# Patient Record
Sex: Female | Born: 1975 | Race: White | Hispanic: No | Marital: Single | Smoking: Current every day smoker
Health system: Southern US, Community
[De-identification: ages and names within clinical notes are randomized; demographics above are authoritative.]

## PROBLEM LIST (undated history)

## (undated) DIAGNOSIS — T4145XA Adverse effect of unspecified anesthetic, initial encounter: Secondary | ICD-10-CM

## (undated) DIAGNOSIS — K219 Gastro-esophageal reflux disease without esophagitis: Secondary | ICD-10-CM

## (undated) DIAGNOSIS — R011 Cardiac murmur, unspecified: Secondary | ICD-10-CM

## (undated) DIAGNOSIS — T8859XA Other complications of anesthesia, initial encounter: Secondary | ICD-10-CM

## (undated) HISTORY — PX: HERNIA REPAIR: SHX51

## (undated) HISTORY — PX: HIP SURGERY: SHX245

## (undated) HISTORY — PX: CHOLECYSTECTOMY: SHX55

## (undated) HISTORY — PX: TONSILLECTOMY: SUR1361

---

## 2005-06-09 ENCOUNTER — Observation Stay: Payer: Self-pay

## 2005-06-17 ENCOUNTER — Inpatient Hospital Stay: Payer: Self-pay | Admitting: Obstetrics and Gynecology

## 2008-04-06 ENCOUNTER — Emergency Department: Payer: Self-pay | Admitting: Emergency Medicine

## 2008-05-01 ENCOUNTER — Ambulatory Visit: Payer: Self-pay | Admitting: General Surgery

## 2008-05-09 ENCOUNTER — Ambulatory Visit: Payer: Self-pay | Admitting: General Surgery

## 2008-08-22 ENCOUNTER — Ambulatory Visit: Payer: Self-pay | Admitting: General Surgery

## 2008-08-25 ENCOUNTER — Ambulatory Visit: Payer: Self-pay | Admitting: General Surgery

## 2010-08-12 ENCOUNTER — Other Ambulatory Visit: Payer: Self-pay | Admitting: General Surgery

## 2010-08-13 ENCOUNTER — Ambulatory Visit: Payer: Self-pay | Admitting: General Surgery

## 2010-08-27 ENCOUNTER — Ambulatory Visit: Payer: Self-pay | Admitting: General Surgery

## 2011-08-04 ENCOUNTER — Encounter: Payer: Self-pay | Admitting: Maternal & Fetal Medicine

## 2012-02-03 ENCOUNTER — Inpatient Hospital Stay: Payer: Self-pay

## 2012-02-04 LAB — CBC WITH DIFFERENTIAL/PLATELET
Basophil #: 0 10*3/uL (ref 0.0–0.1)
Eosinophil #: 0.1 10*3/uL (ref 0.0–0.7)
HCT: 33.9 % — ABNORMAL LOW (ref 35.0–47.0)
Lymphocyte #: 1.8 10*3/uL (ref 1.0–3.6)
MCH: 32.2 pg (ref 26.0–34.0)
MCHC: 35.7 g/dL (ref 32.0–36.0)
MCV: 90 fL (ref 80–100)
Monocyte #: 0.5 x10 3/mm (ref 0.2–0.9)
Monocyte %: 4.8 %
Neutrophil #: 8.4 10*3/uL — ABNORMAL HIGH (ref 1.4–6.5)
Platelet: 251 10*3/uL (ref 150–440)
RDW: 14.3 % (ref 11.5–14.5)
WBC: 11 10*3/uL (ref 3.6–11.0)

## 2012-02-05 LAB — HEMATOCRIT: HCT: 30.3 % — ABNORMAL LOW (ref 35.0–47.0)

## 2014-07-16 NOTE — Consult Note (Signed)
Referral Information:   Reason for Referral 39 year old G2P1001 at with EDD 02/10/2012 at 12 weeks 6 days by LMP consistent with ultrasound performed at [facility] today presents for consultation due to previous child with isolated clubbed foot, advanced maternal age, and maternal bmi>45.    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, now with normal alignment   Home Medications: Medication Instructions Status  ondansetron 8 mg oral tablet 1 tab(s) orally 2 times a day, As Needed- for Nausea, Vomiting  Active   Allergies:   PCN: Rash  Codeine: Rash  Tylenol: Rash  Vital Signs/Notes:  Nursing Vital Signs: **Vital Signs.:   13-May-13 08:48   Vital Signs Type Routine   Temperature Temperature (F) 96.7   Celsius 35.9   Temperature Source oral   Pulse Pulse 85   Pulse source per Dinamap   Respirations Respirations 14   Systolic BP Systolic BP 403   Diastolic BP (mmHg) Diastolic BP (mmHg) 77   Mean BP 97   BP Source Dinamap   Perinatal Consult:   PSurg Hx hip surgery following childhood trauma    FHx MI, MI--age 79 father (obesity, tobacco, etoh use)    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 --We addressed the low risk of recurrence (less than 2%) for clubbed foot.  --She met with our genetic counselor today(please see that letter for details) to discuss risks for  aneuploidy.  She elected to have noninvasive prenatal testing today/circulating cell free fetal DNA testing and a first trimester ultrasound 2. We addressed some of the concerns associated with maternal bmi over 40 including preeclampsia, gestational diabetes, and fetal macrosomia.  We also addressed IOM weight gain reccs (~15lbs) and the need for healthy dietary choices during pregnancy.  Recommendations for maternal baseline studies and fetal surveillance are outlined below.    Recommendations 1.  Cell free fetal DNA testing performed today. We will call with results. 2. Recommend detailed anatomic survey and serial growth assessments.  3. Recommend baseline maternal ECG, tSH, CMP, protein;Creatinine ratio (if not already performed)   Plan:   Genetic Counseling yes    Prenatal Diagnosis Options Level II [place]    Ultrasound at what gestational ages 61 and 71 weeks    Additional Testing Folate/prenatal vitamins     Total Time Spent with Patient 30 minutes    >50% of visit spent in couseling/coordination of care yes    Office Use Only [address]  Level 2 (6min) NEW office consult exp prob focused   Coding Description: MATERNAL CONDITIONS/HISTORY INDICATION(S).   Adv. Maternal Age - multigravida.   Obesity - BMI greater than equal to 30.  Electronic Signatures: Manfred Shirts (MD)  (Signed 13-May-13 14:28)  Authored: Referral, Home Medications, Allergies, Vital Signs/Notes, Consult, Exam, Radiology, Lab/Radiology Notes, Impression, Plan, Billing, Coding Description   Last Updated: 13-May-13 14:28 by Manfred Shirts (MD)

## 2014-08-01 NOTE — H&P (Signed)
L&D Evaluation:  History:   HPI Corrections    Patient's Surgical History Hip surgery, Tonsillectomy, herniax2, Cholecystectomy    Allergies PCN, Codeine   Electronic Signatures: Albertina ParrLugiano, Johanan Skorupski B (CNM)  (Signed [id number]-Nov-13 09:16)  Authored: L&D Evaluation   Last Updated: 13-Nov-13 09:16 by Albertina ParrLugiano, Maleko Greulich B (CNM)

## 2014-08-01 NOTE — H&P (Signed)
L&D Evaluation:  History:   HPI 39 y/o G2P1001 @ 39 EDC 02/10/12 arrived 2000 for cervidil ripening and IOL in am. [facility] (extreme obesity) FHR no decels but rare accels (nonreassuring) -sent to LD for prolonged monitoring-reactive strip- then home to prepare for IOL and back to LD at 2000. Pregnancy complicated by extreme obesity. Denied leaking fluid, vaginal bleeding or contractions, baby less active past day. GBS negative    Presents with abdominal pain    Patient's Medical History No Chronic Illness    Patient's Surgical History none    Medications Pre Natal Vitamins    Allergies NKDA    Social History none    Family History Non-Contributory   ROS:   ROS All systems were reviewed.  HEENT, CNS, GI, GU, Respiratory, CV, Renal and Musculoskeletal systems were found to be normal.   Exam:   Vital Signs stable    Urine Protein not completed    General no apparent distress    Mental Status clear    Chest clear    Heart normal sinus rhythm    Abdomen gravid, non-tender    Estimated Fetal Weight Large for gestational age    Fetal Position vtx    Fundal Height term    Back no CVAT    Edema 1+    Reflexes 2+    Clonus negative    Pelvic no external lesions, 2-3cm @ office today current exam 4cm thick vtx @ -2 BOWI small bloody show.    Mebranes Intact    FHT baseline 150's see description below    FHT Description 144    Ucx irregular, mild    Skin dry    Lymph no lymphadenopathy   Impression:   Impression decreased fetal movement, Cervidil IOL   Plan:   Plan EFM/NST, monitor contractions and for cervical change    Comments Cervidil placed with reactive tracing, occasional variable decels noted with pt on her back. Repositioned to left side 02 in place. FHR baseline 150's min/avg variability. FHR decel @ 0100 x 2 minutes down to 70's CNM contacted cervidil removed. FHr back up to baseline, repositioned. Cervical exam, 4cm thick vtx @ -2  bloody show present BOWI. Cervidil to remain out, CNM on unit. Pt reassured, questions answered.FHR 140's 150's occ accels, no uc's other than occasional cramping per pt, abdomen soft. EFM adjusted.   Electronic Signatures: Albertina ParrLugiano, Persia Lintner B (CNM)  (Signed [id number]-Nov-13 02:33)  Authored: L&D Evaluation   Last Updated: 13-Nov-13 02:33 by Albertina ParrLugiano, Matt Delpizzo B (CNM)

## 2015-04-24 ENCOUNTER — Emergency Department (HOSPITAL_COMMUNITY): Payer: BLUE CROSS/BLUE SHIELD

## 2015-04-24 ENCOUNTER — Emergency Department (HOSPITAL_COMMUNITY)
Admission: EM | Admit: 2015-04-24 | Discharge: 2015-04-24 | Disposition: A | Discharge status: Home / Self Care | Payer: BLUE CROSS/BLUE SHIELD | Attending: Emergency Medicine | Admitting: Emergency Medicine

## 2015-04-24 ENCOUNTER — Encounter (HOSPITAL_COMMUNITY): Payer: Self-pay

## 2015-04-24 DIAGNOSIS — Y999 Unspecified external cause status: Secondary | ICD-10-CM | POA: Diagnosis not present

## 2015-04-24 DIAGNOSIS — S0990XA Unspecified injury of head, initial encounter: Secondary | ICD-10-CM | POA: Diagnosis present

## 2015-04-24 DIAGNOSIS — W01118A Fall on same level from slipping, tripping and stumbling with subsequent striking against other sharp object, initial encounter: Secondary | ICD-10-CM | POA: Insufficient documentation

## 2015-04-24 DIAGNOSIS — Y92513 Shop (commercial) as the place of occurrence of the external cause: Secondary | ICD-10-CM | POA: Insufficient documentation

## 2015-04-24 DIAGNOSIS — S060X0A Concussion without loss of consciousness, initial encounter: Secondary | ICD-10-CM | POA: Insufficient documentation

## 2015-04-24 DIAGNOSIS — S01111A Laceration without foreign body of right eyelid and periocular area, initial encounter: Secondary | ICD-10-CM | POA: Diagnosis not present

## 2015-04-24 DIAGNOSIS — Y9301 Activity, walking, marching and hiking: Secondary | ICD-10-CM | POA: Insufficient documentation

## 2015-04-24 DIAGNOSIS — S8992XA Unspecified injury of left lower leg, initial encounter: Secondary | ICD-10-CM | POA: Diagnosis not present

## 2015-04-24 MED ORDER — TETANUS-DIPHTH-ACELL PERTUSSIS 5-2.5-18.5 LF-MCG/0.5 IM SUSP
0.5000 mL | Freq: Once | INTRAMUSCULAR | Status: DC
  Filled 2015-04-24: qty 0.5

## 2015-04-24 MED ORDER — TETANUS-DIPHTH-ACELL PERTUSSIS 5-2.5-18.5 LF-MCG/0.5 IM SUSP
0.5000 mL | Freq: Once | INTRAMUSCULAR | Status: AC
  Administered 2015-04-24: 0.5 mL via INTRAMUSCULAR

## 2015-04-24 MED ORDER — LIDOCAINE-EPINEPHRINE 2 %-1:200000 IJ SOLN
10.0000 mL | Freq: Once | INTRAMUSCULAR | Status: AC
Start: 2015-04-24 — End: 2015-04-24
  Administered 2015-04-24: 10 mL
  Filled 2015-04-24: qty 10

## 2015-04-24 NOTE — ED Provider Notes (Signed)
CSN: [id number]     Arrival date & time 04/24/15  2031 History  By signing my name below, I, Jamie Potts, attest that this documentation has been prepared under the direction and in the presence of Jamie Camprubi-Soms, PA-C. Electronically Signed: Budd Potts, ED Scribe. 04/24/2015. 9:19 PM.    Chief Complaint  Patient presents with  . Fall  . Head Laceration   Patient is a 40 y.o. female presenting with scalp laceration. The history is provided by the patient. No language interpreter was used.  Head Laceration This is a new problem. The current episode started 1 to 2 hours ago. The problem occurs rarely. The problem has been gradually improving. Pertinent negatives include no chest pain, no abdominal pain, no headaches and no shortness of breath. Nothing aggravates the symptoms. Nothing relieves the symptoms. She has tried nothing for the symptoms. The treatment provided no relief.   HPI Comments: Jamie Potts is a 40 y.o. female who presents to the Emergency Department complaining of a painful laceration just above the right eyebrow, sustained after a mechanical fall that occurred 1.5 hours ago. Pt states she was walking in [place] when she slipped and fell, striking her head on a wine bottle she was holding in her hand at the time, sustaining a laceration. She has applied a Band-Aid to control the bleeding, but has not taken any medication for this. Bleeding was controlled with pressure, but still oozes when bandage is removed. She reports associated intermittent, 3/10 dull, non-radiating pain to the laceration site, with no known aggravating factors, and no tx tried PTA. She also endorses mild left knee pain, but is not very concerned about this. She notes she had a small glass of wine (~4-6oz) before going to the store. She does not recall her last tetanus shot. She is not on any blood thinners. She denies any other medical issues. She has an IUD, so she has not had a menses in 3  years. Pt denies LOC, vision changes, HA, tinnitus, hearing loss, neck pain, back pain, lightheadedness, abd pain, n/v/d, constipation, urinary symptoms, numbness, tingling, weakness, CP, and SOB.  Pt is allergic to codeine (causes swelling) and penicillins.   History reviewed. No pertinent past medical history. Past Surgical History  Procedure Laterality Date  . Tonsillectomy    . Hip surgery Right   . Cholecystectomy     No family history on file. Social History  Substance Use Topics  . Smoking status: Never Smoker   . Smokeless tobacco: None  . Alcohol Use: Yes     Comment: occasionally   OB History    No data available     Review of Systems  HENT: Positive for facial swelling (R eyebrow). Negative for hearing loss and tinnitus.   Eyes: Negative for photophobia and visual disturbance.  Respiratory: Negative for shortness of breath.   Cardiovascular: Negative for chest pain.  Gastrointestinal: Negative for nausea, vomiting, abdominal pain, diarrhea and constipation.  Genitourinary: Negative for dysuria, hematuria and difficulty urinating.  Musculoskeletal: Positive for arthralgias (L knee). Negative for back pain, joint swelling and neck pain.  Skin: Positive for wound (R eyebrow lac).  Allergic/Immunologic: Negative for immunocompromised state.  Neurological: Negative for syncope, weakness, light-headedness, numbness and headaches.  Hematological: Does not bruise/bleed easily.  Psychiatric/Behavioral: Negative for confusion.   10 Systems reviewed and all are negative for acute change except as noted in the HPI.   Allergies  Codeine and Penicillins  Home Medications   Prior to Admission  medications   Not on File   BP 125/93 mmHg  Pulse 97  Temp(Src) 98 F (36.7 C) (Oral)  Resp 16  SpO2 97% Physical Exam  Constitutional: She is oriented to person, place, and time. Vital signs are normal. She appears well-developed and well-nourished.  Non-toxic appearance. No  distress.  Afebrile, nontoxic, NAD  HENT:  Head: Normocephalic. Head is with laceration. Head is without raccoon's eyes and without Battle's sign.  Mouth/Throat: Oropharynx is clear and moist and mucous membranes are normal. Normal dentition.  Right eyebrow laceration with some mild oozing, but no active hemorrhage. No scalp crepitus or step offs, mildly TTP over the R eyebrow without deformity. No racoon eyes or battle sign. No other maxillofacial TTP. No malocclusion or dentition injury. SEE PICTURE BELOW  Eyes: Conjunctivae are normal. Pupils are equal, round, and reactive to light. Right eye exhibits no discharge. Left eye exhibits no discharge. Right eye exhibits nystagmus. Right eye exhibits normal extraocular motion. Left eye exhibits nystagmus. Left eye exhibits normal extraocular motion.  PERRL, EOMI, with 3 beat horizontal nystagmus in both directions in both eyes, no visual field deficits. No vertical nystagmus.  Neck: Normal range of motion. Neck supple. No spinous process tenderness and no muscular tenderness present. No rigidity. Normal range of motion present.  FROM intact without spinous process TTP, no bony stepoffs or deformities, no paraspinous muscle TTP or muscle spasms. No rigidity or meningeal signs. No bruising or swelling.   Cardiovascular: Normal rate and intact distal pulses.   Pulmonary/Chest: Effort normal. No respiratory distress.  Abdominal: Normal appearance. She exhibits no distension.  Musculoskeletal: Normal range of motion.       Left knee: She exhibits normal range of motion, no swelling, no effusion, no deformity, normal alignment, no LCL laxity, normal patellar mobility and no MCL laxity. Tenderness found. Lateral joint line tenderness noted.       Thoracic back: Normal.       Lumbar back: Normal.  MAE x4 Strength and sensation grossly intact Distal pulses intact Gait steady All spinal levels non-TTP with no bony step offs.  L knee with FROM intact, with  mild lateral joint line TTP, no swelling/effusion/deformity, no bruising, no abnormal alignment or patellar mobility, no varus/valgus laxity, neg anterior drawer test, no crepitus.   Neurological: She is alert and oriented to person, place, and time. She has normal strength. No cranial nerve deficit or sensory deficit. She displays a negative Romberg sign. Coordination and gait normal. GCS eye subscore is 4. GCS verbal subscore is 5. GCS motor subscore is 6.  CN 2-12 grossly intact A&O x4 GCS 15 Sensation and strength intact Gait nonataxic including with tandem walking Coordination with finger-to-nose WNL Neg pronator drift  Negative Romberg  Skin: Skin is warm and dry. Laceration noted. No rash noted.  R eyebrow laceration as noted above and pictured below  Psychiatric: She has a normal mood and affect. Her behavior is normal.  Nursing note and vitals reviewed.     ED Course  .Marland KitchenLaceration Repair Date/Time: 04/24/2015 10:56 PM Performed by: Allen Derry Authorized by: Allen Derry Consent: Verbal consent obtained. Risks and benefits: risks, benefits and alternatives were discussed Consent given by: patient Patient understanding: patient states understanding of the procedure being performed Patient consent: the patient's understanding of the procedure matches consent given Patient identity confirmed: verbally with patient Body area: head/neck Location details: right eyebrow Laceration length: 5 cm Foreign bodies: no foreign bodies Tendon involvement: none Nerve involvement: none Vascular damage:  no Anesthesia: local infiltration Local anesthetic: lidocaine 2% with epinephrine Anesthetic total: 3 ml Patient sedated: no Preparation: Patient was prepped and draped in the usual sterile fashion. Irrigation solution: saline Irrigation method: syringe Amount of cleaning: standard Debridement: none Degree of undermining: none Skin closure: 5-0 Prolene Number  of sutures: 6 Technique: simple Approximation: close Approximation difficulty: simple Dressing: 4x4 sterile gauze and antibiotic ointment Patient tolerance: Patient tolerated the procedure well with no immediate complications Comments: Performed by Medical Student Eric Bifolck (unable to change provider on repair procedure due to medical student not being in the system) under my DIRECT supervision    DIAGNOSTIC STUDIES: Oxygen Saturation is 97% on RA, adequate by my interpretation.    COORDINATION OF CARE: 9:08 PM - Discussed plans to order diagnostic imaging, as well as a Tdap. Will numb, cleanse, and suture the wound. Pt advised of plan for treatment and pt agrees.  Labs Review Labs Reviewed - No data to display  Imaging Review Ct Head Wo Contrast  04/24/2015  CLINICAL DATA:  Fall in [place] with head laceration. Initial encounter. EXAM: CT HEAD WITHOUT CONTRAST CT MAXILLOFACIAL WITHOUT CONTRAST TECHNIQUE: Multidetector CT imaging of the head and maxillofacial structures were performed using the standard protocol without intravenous contrast. Multiplanar CT image reconstructions of the maxillofacial structures were also generated. COMPARISON:  None. FINDINGS: CT HEAD FINDINGS Skull and Sinuses:Facial findings described below. Negative for calvarial fracture. No hemo sinus. Brain: Normal. No evidence of acute infarction, hemorrhage, hydrocephalus, or mass lesion/mass effect. CT MAXILLOFACIAL FINDINGS Deep laceration above the right orbit without fracture, opaque foreign body, or evidence of globe injury. No postseptal hematoma. No hemo sinus. Impacted left upper central incisor. IMPRESSION: Right supraorbital laceration without fracture. Negative for intracranial injury. Electronically Signed   By: Marnee Spring M.D.   On: 04/24/2015 21:54   Ct Maxillofacial Wo Cm  04/24/2015  CLINICAL DATA:  Fall in [place] with head laceration. Initial encounter. EXAM: CT HEAD WITHOUT CONTRAST CT  MAXILLOFACIAL WITHOUT CONTRAST TECHNIQUE: Multidetector CT imaging of the head and maxillofacial structures were performed using the standard protocol without intravenous contrast. Multiplanar CT image reconstructions of the maxillofacial structures were also generated. COMPARISON:  None. FINDINGS: CT HEAD FINDINGS Skull and Sinuses:Facial findings described below. Negative for calvarial fracture. No hemo sinus. Brain: Normal. No evidence of acute infarction, hemorrhage, hydrocephalus, or mass lesion/mass effect. CT MAXILLOFACIAL FINDINGS Deep laceration above the right orbit without fracture, opaque foreign body, or evidence of globe injury. No postseptal hematoma. No hemo sinus. Impacted left upper central incisor. IMPRESSION: Right supraorbital laceration without fracture. Negative for intracranial injury. Electronically Signed   By: Marnee Spring M.D.   On: 04/24/2015 21:54   I have personally reviewed and evaluated these images and lab results as part of my medical decision-making.   MDM   Final diagnoses:  Head injury, initial encounter  Eyebrow laceration, right, initial encounter  Concussion, without loss of consciousness, initial encounter    40 y.o. female here with mechanical fall and R eyebrow lac when she struck her head on a wine bottle in [employer]. She admits to drinking wine today, ~4-6 ounces of wine prior to incident. No LOC. Some horizontal nystagmus noted during exam, but no other focal neuro deficits. Given +EtOH use today, will proceed with CT imaging to ensure no intracranial bleeding or maxillofacial injury. Will update tetanus. Will set up for lac repair. Pt declines wanting pain meds. Will reassess shortly.   9:20 PM Xray tech stating that pt  declines wanting L knee xray. Discussed reasoning on why, and she still declines. Will discontinue this one. Will proceed with CT head/maxillofacial and suture repair of lac. Will reassess shortly.   10:58 PM CT neg. Suture repair,  6 sutures used, good hemostasis. Wound cleaned and dressed. Discussed concussion protocol with mental rest. Ice, tylenol/ibuprofen discussed. F/up with PCP or [facility] in 7-10 days for recheck and suture removal. Doubt need for prophylactic abx. I explained the diagnosis and have given explicit precautions to return to the ER including for any other new or worsening symptoms. The patient understands and accepts the medical plan as it's been dictated and I have answered their questions. Discharge instructions concerning home care and prescriptions have been given. The patient is STABLE and is discharged to home in good condition.   I personally performed the services described in this documentation, which was scribed in my presence. The recorded information has been reviewed and is accurate.    BP 122/88 mmHg  Pulse 87  Temp(Src) 98 F (36.7 C) (Oral)  Resp 16  SpO2 98%  Meds ordered this encounter  Medications  . DISCONTD: Tdap (BOOSTRIX) injection 0.5 mL    Sig:   . lidocaine-EPINEPHrine (XYLOCAINE W/EPI) 2 %-1:200000 (PF) injection 10 mL    Sig:   . Tdap (BOOSTRIX) injection 0.5 mL    SigAllen Derry, PA-C 04/24/15 2311   Medical screening examination/treatment/procedure(s) were conducted as a shared visit with non-physician practitioner(s) and myself.  I personally evaluated the patient during the encounter.  The patient was awake, alert, in no distress. Patient had a minor fall, with full recall of the event. I was present for, assisted with and supervised to the laceration repair, which was well tolerated, with no complications.     Gerhard Munch, MD 04/25/15 0040

## 2015-04-24 NOTE — ED Notes (Signed)
Pt stable, ambulatory, states understanding of discharge instructions 

## 2015-04-24 NOTE — Discharge Instructions (Signed)
Laceration instructions: Keep wound clean with mild soap and water. Keep area covered with a topical antibiotic ointment and bandage, keep bandage dry, and do not submerge in water for 24 hours. Ice and elevate for additional pain relief and swelling. Alternate between ibuprofen and Tylenol for additional pain relief. Follow up with your primary care doctor or the [facility] in approximately 7-10 days for wound recheck and suture removal. Monitor area for signs of infection to include, but not limited to: increasing pain, spreading redness, drainage/pus, worsening swelling, or fevers. Return to emergency department for emergent changing or worsening symptoms.   Concussion instructions: Use Ibuprofen or Tylenol for pain. Get plenty of rest, use ice on your head.  Stay in a quiet, not simulating, dark environment. No TV, computer use, video games, or cell phone use until headache is resolved completely. Follow Up with primary care physician in 3-4 days if headache persists.  Return to the emergency department if patient becomes lethargic, begins vomiting or other change in mental status.    Concussion, Adult A concussion, or closed-head injury, is a brain injury caused by a direct blow to the head or by a quick and sudden movement (jolt) of the head or neck. Concussions are usually not life-threatening. Even so, the effects of a concussion can be serious. If you have had a concussion before, you are more likely to experience concussion-like symptoms after a direct blow to the head.  CAUSES  Direct blow to the head, such as from running into another player during a soccer game, being hit in a fight, or hitting your head on a hard surface.  A jolt of the head or neck that causes the brain to move back and forth inside the skull, such as in a car crash. SIGNS AND SYMPTOMS The signs of a concussion can be hard to notice. Early on, they may be missed by you, family members, and health care  providers. You may look fine but act or feel differently. Symptoms are usually temporary, but they may last for days, weeks, or even longer. Some symptoms may appear right away while others may not show up for hours or days. Every head injury is different. Symptoms include:  Mild to moderate headaches that will not go away.  A feeling of pressure inside your head.  Having more trouble than usual:  Learning or remembering things you have heard.  Answering questions.  Paying attention or concentrating.  Organizing daily tasks.  Making decisions and solving problems.  Slowness in thinking, acting or reacting, speaking, or reading.  Getting lost or being easily confused.  Feeling tired all the time or lacking energy (fatigued).  Feeling drowsy.  Sleep disturbances.  Sleeping more than usual.  Sleeping less than usual.  Trouble falling asleep.  Trouble sleeping (insomnia).  Loss of balance or feeling lightheaded or dizzy.  Nausea or vomiting.  Numbness or tingling.  Increased sensitivity to:  Sounds.  Lights.  Distractions.  Vision problems or eyes that tire easily.  Diminished sense of taste or smell.  Ringing in the ears.  Mood changes such as feeling sad or anxious.  Becoming easily irritated or angry for little or no reason.  Lack of motivation.  Seeing or hearing things other people do not see or hear (hallucinations). DIAGNOSIS Your health care provider can usually diagnose a concussion based on a description of your injury and symptoms. He or she will ask whether you passed out (lost consciousness) and whether you  are having trouble remembering events that happened right before and during your injury. Your evaluation might include:  A brain scan to look for signs of injury to the brain. Even if the test shows no injury, you may still have a concussion.  Blood tests to be sure other problems are not present. TREATMENT  Concussions are usually  treated in an emergency department, in urgent care, or at a clinic. You may need to stay in the hospital overnight for further treatment.  Tell your health care provider if you are taking any medicines, including prescription medicines, over-the-counter medicines, and natural remedies. Some medicines, such as blood thinners (anticoagulants) and aspirin, may increase the chance of complications. Also tell your health care provider whether you have had alcohol or are taking illegal drugs. This information may affect treatment.  Your health care provider will send you home with important instructions to follow.  How fast you will recover from a concussion depends on many factors. These factors include how severe your concussion is, what part of your brain was injured, your age, and how healthy you were before the concussion.  Most people with mild injuries recover fully. Recovery can take time. In general, recovery is slower in older persons. Also, persons who have had a concussion in the past or have other medical problems may find that it takes longer to recover from their current injury. HOME CARE INSTRUCTIONS General Instructions  Carefully follow the directions your health care provider gave you.  Only take over-the-counter or prescription medicines for pain, discomfort, or fever as directed by your health care provider.  Take only those medicines that your health care provider has approved.  Do not drink alcohol until your health care provider says you are well enough to do so. Alcohol and certain other drugs may slow your recovery and can put you at risk of further injury.  If it is harder than usual to remember things, write them down.  If you are easily distracted, try to do one thing at a time. For example, do not try to watch TV while fixing dinner.  Talk with family members or close friends when making important decisions.  Keep all follow-up appointments. Repeated evaluation of  your symptoms is recommended for your recovery.  Watch your symptoms and tell others to do the same. Complications sometimes occur after a concussion. Older adults with a brain injury may have a higher risk of serious complications, such as a blood clot on the brain.  Tell your teachers, school nurse, school counselor, coach, athletic trainer, or work Production designer, theatre/television/film about your injury, symptoms, and restrictions. Tell them about what you can or cannot do. They should watch for:  Increased problems with attention or concentration.  Increased difficulty remembering or learning new information.  Increased time needed to complete tasks or assignments.  Increased irritability or decreased ability to cope with stress.  Increased symptoms.  Rest. Rest helps the brain to heal. Make sure you:  Get plenty of sleep at night. Avoid staying up late at night.  Keep the same bedtime hours on weekends and weekdays.  Rest during the day. Take daytime naps or rest breaks when you feel tired.  Limit activities that require a lot of thought or concentration. These include:  Doing homework or job-related work.  Watching TV.  Working on the computer.  Avoid any situation where there is potential for another head injury (football, hockey, soccer, basketball, martial arts, downhill snow sports and horseback riding). Your condition will  get worse every time you experience a concussion. You should avoid these activities until you are evaluated by the appropriate follow-up health care providers. Returning To Your Regular Activities You will need to return to your normal activities slowly, not all at once. You must give your body and brain enough time for recovery.  Do not return to sports or other athletic activities until your health care provider tells you it is safe to do so.  Ask your health care provider when you can drive, ride a bicycle, or operate heavy machinery. Your ability to react may be slower after  a brain injury. Never do these activities if you are dizzy.  Ask your health care provider about when you can return to work or school. Preventing Another Concussion It is very important to avoid another brain injury, especially before you have recovered. In rare cases, another injury can lead to permanent brain damage, brain swelling, or death. The risk of this is greatest during the first 7-10 days after a head injury. Avoid injuries by:  Wearing a seat belt when riding in a car.  Drinking alcohol only in moderation.  Wearing a helmet when biking, skiing, skateboarding, skating, or doing similar activities.  Avoiding activities that could lead to a second concussion, such as contact or recreational sports, until your health care provider says it is okay.  Taking safety measures in your home.  Remove clutter and tripping hazards from floors and stairways.  Use grab bars in bathrooms and handrails by stairs.  Place non-slip mats on floors and in bathtubs.  Improve lighting in dim areas. SEEK MEDICAL CARE IF:  You have increased problems paying attention or concentrating.  You have increased difficulty remembering or learning new information.  You need more time to complete tasks or assignments than before.  You have increased irritability or decreased ability to cope with stress.  You have more symptoms than before. Seek medical care if you have any of the following symptoms for more than 2 weeks after your injury:  Lasting (chronic) headaches.  Dizziness or balance problems.  Nausea.  Vision problems.  Increased sensitivity to noise or light.  Depression or mood swings.  Anxiety or irritability.  Memory problems.  Difficulty concentrating or paying attention.  Sleep problems.  Feeling tired all the time. SEEK IMMEDIATE MEDICAL CARE IF:  You have severe or worsening headaches. These may be a sign of a blood clot in the brain.  You have weakness (even if  only in one hand, leg, or part of the face).  You have numbness.  You have decreased coordination.  You vomit repeatedly.  You have increased sleepiness.  One pupil is larger than the other.  You have convulsions.  You have slurred speech.  You have increased confusion. This may be a sign of a blood clot in the brain.  You have increased restlessness, agitation, or irritability.  You are unable to recognize people or places.  You have neck pain.  It is difficult to wake you up.  You have unusual behavior changes.  You lose consciousness. MAKE SURE YOU:  Understand these instructions.  Will watch your condition.  Will get help right away if you are not doing well or get worse.   This information is not intended to replace advice given to you by your health care provider. Make sure you discuss any questions you have with your health care provider.   Document Released: 05/31/2003 Document Revised: 03/31/2014 Document Reviewed: 09/30/2012 Elsevier Interactive  Patient Education 2016 [employer]  Facial Laceration A facial laceration is a cut on the face. These injuries can be painful and cause bleeding. Some cuts may need to be closed with stitches (sutures), skin adhesive strips, or wound glue. Cuts usually heal quickly but can leave a scar. It can take 1-2 years for the scar to go away completely. HOME CARE   Only take medicines as told by your doctor.  Follow your doctor's instructions for wound care. For Stitches:  Keep the cut clean and dry.  If you have a bandage (dressing), change it at least once a day. Change the bandage if it gets wet or dirty, or as told by your doctor.  Wash the cut with soap and water 2 times a day. Rinse the cut with water. Pat it dry with a clean towel.  Put a thin layer of medicated cream on the cut as told by your doctor.  You may shower after the first 24 hours. Do not soak the cut in water until the stitches are  removed.  Have your stitches removed as told by your doctor.  Do not wear any makeup until a few days after your stitches are removed. For Skin Adhesive Strips:  Keep the cut clean and dry.  Do not get the strips wet. You may take a bath, but be careful to keep the cut dry.  If the cut gets wet, pat it dry with a clean towel.  The strips will fall off on their own. Do not remove the strips that are still stuck to the cut. For Wound Glue:  You may shower or take baths. Do not soak or scrub the cut. Do not swim. Avoid heavy sweating until the glue falls off on its own. After a shower or bath, pat the cut dry with a clean towel.  Do not put medicine or makeup on your cut until the glue falls off.  If you have a bandage, do not put tape over the glue.  Avoid lots of sunlight or tanning lamps until the glue falls off.  The glue will fall off on its own in 5-10 days. Do not pick at the glue. After Healing:  Put sunscreen on the cut for the first year to reduce your scar. GET HELP IF:  You have a fever. GET HELP RIGHT AWAY IF:   Your cut area gets red, painful, or puffy (swollen).  You see a yellowish-white fluid (pus) coming from the cut.   This information is not intended to replace advice given to you by your health care provider. Make sure you discuss any questions you have with your health care provider.   Document Released: 08/27/2007 Document Revised: 03/31/2014 Document Reviewed: 10/21/2012 Elsevier Interactive Patient Education 2016 [employer]  Cryotherapy Cryotherapy is when you put ice on your injury. Ice helps lessen pain and puffiness (swelling) after an injury. Ice works the best when you start using it in the first 24 to 48 hours after an injury. HOME CARE  Put a dry or damp towel between the ice pack and your skin.  You may press gently on the ice pack.  Leave the ice on for no more than 10 to 20 minutes at a time.  Check your skin after 5 minutes to  make sure your skin is okay.  Rest at least 20 minutes between ice pack uses.  Stop using ice when your skin loses feeling (numbness).  Do not use ice on someone who cannot  tell you when it hurts. This includes small children and people with memory problems (dementia). GET HELP RIGHT AWAY IF:  You have white spots on your skin.  Your skin turns blue or pale.  Your skin feels waxy or hard.  Your puffiness gets worse. MAKE SURE YOU:   Understand these instructions.  Will watch your condition.  Will get help right away if you are not doing well or get worse.   This information is not intended to replace advice given to you by your health care provider. Make sure you discuss any questions you have with your health care provider.   Document Released: 08/27/2007 Document Revised: 06/02/2011 Document Reviewed: 10/31/2010 Elsevier Interactive Patient Education 2016 [employer]   Sutured Wound Care Sutures are stitches that can be used to close wounds. Taking care of your wound properly can help prevent pain and infection. It can also help your wound to heal more quickly. HOW TO CARE FOR YOUR SUTURED WOUND Wound Care  Keep the wound clean and dry.  If you were given a bandage (dressing), change it at least one time per day or as told by your doctor. You should also change it if it gets wet or dirty.  Keep the wound completely dry for the first 24 hours or as told by your doctor. After that time, you may shower or bathe. However, make sure that the wound is not soaked in water until the sutures have been removed.  Clean the wound one time each day or as told by your doctor.  Wash the wound with soap and water.  Rinse the wound with water to remove all soap.  Pat the wound dry with a clean towel. Do not rub the wound.  After cleaning the wound, put a thin layer of antibiotic ointment on it as told your doctor. This ointment:  Helps to prevent infection.  Keeps the  bandage from sticking to the wound.  Have the sutures removed as told by your doctor. General Instructions  Take or apply medicines only as told by your doctor.  To help prevent scarring, make sure to cover your wound with sunscreen whenever you are outside after the sutures are removed and the wound is healed. Make sure to wear a sunscreen of at least 30 SPF.  If you were prescribed an antibiotic medicine or ointment, finish all of it even if you start to feel better.  Do not scratch or pick at the wound.  Keep all follow-up visits as told by your doctor. This is important.  Check your wound every day for signs of infection. Watch for:  Redness, swelling, or pain.  Fluid, blood, or pus.  Raise (elevate) the injured area above the level of your heart while you are sitting or lying down, if possible.  Avoid stretching your wound.  Drink enough fluids to keep your pee (urine) clear or pale yellow. GET HELP IF:  You were given a tetanus shot and you have any of these where the needle went in:  Swelling.  Very bad pain.  Redness.  Bleeding.  You have a fever.  A wound that was closed breaks open.  You notice a bad smell coming from the wound.  You notice something coming out of the wound, such as wood or glass.  Medicine does not help your pain.  You have any of these at the site of the wound.  More redness.  More swelling.  More pain.  You have any of these  coming from the wound.  Fluid.  Blood.  Pus.  You notice a change in the color of your skin near the wound.  You need to change the bandage often due to fluid, blood, or pus coming from the wound.  You have a new rash.  You have numbness around the wound. GET HELP RIGHT AWAY IF:  You have very bad swelling around the wound.  Your pain suddenly gets worse and is very bad.  You have painful lumps near the wound or on skin that is anywhere on your body.  You have a red streak going away from  the wound.  The wound is on your hand or foot and you cannot move a finger or toe like normal.  The wound is on your hand or foot and you notice that your fingers or toes look pale or bluish.   This information is not intended to replace advice given to you by your health care provider. Make sure you discuss any questions you have with your health care provider.   Document Released: 08/27/2007 Document Revised: 07/25/2014 Document Reviewed: 10/20/2012 Elsevier Interactive Patient Education [employer]

## 2015-04-24 NOTE — ED Notes (Signed)
Was in [employer] walking and slipped falling and hit her head on the wine bottle she had in her hand. No loc. Lac to her forehead just above the right eyebrow.

## 2015-10-02 ENCOUNTER — Other Ambulatory Visit: Payer: Self-pay | Admitting: Family Medicine

## 2015-10-02 ENCOUNTER — Ambulatory Visit
Admission: RE | Admit: 2015-10-02 | Discharge: 2015-10-02 | Disposition: A | Discharge status: Home / Self Care | Payer: BLUE CROSS/BLUE SHIELD | Source: Ambulatory Visit | Attending: Family Medicine | Admitting: Family Medicine

## 2015-10-02 DIAGNOSIS — M7122 Synovial cyst of popliteal space [Baker], left knee: Secondary | ICD-10-CM | POA: Diagnosis not present

## 2015-10-02 DIAGNOSIS — M25562 Pain in left knee: Secondary | ICD-10-CM | POA: Insufficient documentation

## 2015-10-05 ENCOUNTER — Ambulatory Visit: Payer: BLUE CROSS/BLUE SHIELD

## 2015-12-26 ENCOUNTER — Other Ambulatory Visit: Payer: Self-pay | Admitting: Orthopedic Surgery

## 2015-12-26 DIAGNOSIS — M25462 Effusion, left knee: Secondary | ICD-10-CM

## 2015-12-26 DIAGNOSIS — M2392 Unspecified internal derangement of left knee: Secondary | ICD-10-CM

## 2015-12-26 DIAGNOSIS — G8929 Other chronic pain: Secondary | ICD-10-CM

## 2015-12-26 DIAGNOSIS — M25562 Pain in left knee: Principal | ICD-10-CM

## 2016-01-04 ENCOUNTER — Ambulatory Visit
Admission: RE | Admit: 2016-01-04 | Discharge: 2016-01-04 | Disposition: A | Discharge status: Home / Self Care | Payer: BLUE CROSS/BLUE SHIELD | Source: Ambulatory Visit | Attending: Orthopedic Surgery | Admitting: Orthopedic Surgery

## 2016-01-04 DIAGNOSIS — S83222A Peripheral tear of medial meniscus, current injury, left knee, initial encounter: Secondary | ICD-10-CM | POA: Insufficient documentation

## 2016-01-04 DIAGNOSIS — M948X6 Other specified disorders of cartilage, lower leg: Secondary | ICD-10-CM | POA: Diagnosis not present

## 2016-01-04 DIAGNOSIS — G8929 Other chronic pain: Secondary | ICD-10-CM

## 2016-01-04 DIAGNOSIS — M659 Synovitis and tenosynovitis, unspecified: Secondary | ICD-10-CM | POA: Insufficient documentation

## 2016-01-04 DIAGNOSIS — M25562 Pain in left knee: Secondary | ICD-10-CM

## 2016-01-04 DIAGNOSIS — X58XXXA Exposure to other specified factors, initial encounter: Secondary | ICD-10-CM | POA: Insufficient documentation

## 2016-01-04 DIAGNOSIS — M25462 Effusion, left knee: Secondary | ICD-10-CM

## 2016-01-04 DIAGNOSIS — M2392 Unspecified internal derangement of left knee: Secondary | ICD-10-CM

## 2016-01-22 ENCOUNTER — Encounter
Admission: RE | Admit: 2016-01-22 | Discharge: 2016-01-22 | Disposition: A | Discharge status: Home / Self Care | Payer: BLUE CROSS/BLUE SHIELD | Source: Ambulatory Visit | Attending: Unknown Physician Specialty | Admitting: Unknown Physician Specialty

## 2016-01-22 HISTORY — DX: Cardiac murmur, unspecified: R01.1

## 2016-01-22 HISTORY — DX: Adverse effect of unspecified anesthetic, initial encounter: T41.45XA

## 2016-01-22 HISTORY — DX: Gastro-esophageal reflux disease without esophagitis: K21.9

## 2016-01-22 HISTORY — DX: Other complications of anesthesia, initial encounter: T88.59XA

## 2016-01-22 NOTE — Patient Instructions (Signed)
  Your procedure is scheduled on: 01-30-16 Report to Same Day Surgery 2nd floor medical mall To find out your arrival time please call [phone] between 1PM - 3PM on  01-29-16  Remember: Instructions that are not followed completely may result in serious medical risk, up to and including death, or upon the discretion of your surgeon and anesthesiologist your surgery may need to be rescheduled.    _x___ 1. Do not eat food or drink liquids after midnight. No gum chewing or hard candies.     __x__ 2. No Alcohol for 24 hours before or after surgery.   __x__3. No Smoking for 24 prior to surgery.   ____  4. Bring all medications with you on the day of surgery if instructed.    __x__ 5. Notify your doctor if there is any change in your medical condition     (cold, fever, infections).     Do not wear jewelry, make-up, hairpins, clips or nail polish.  Do not wear lotions, powders, or perfumes. You may wear deodorant.  Do not shave 48 hours prior to surgery. Men may shave face and neck.  Do not bring valuables to the hospital.    [facility] is not responsible for any belongings or valuables.               Contacts, dentures or bridgework may not be worn into surgery.  Leave your suitcase in the car. After surgery it may be brought to your room.  For patients admitted to the hospital, discharge time is determined by your treatment team.   Patients discharged the day of surgery will not be allowed to drive home.    Please read over the following fact sheets that you were given:   [facility] and or MRSA Information   ____ Take these medicines the morning of surgery with A SIP OF WATER:    1. NONE  2.  3.  4.  5.  6.  ____Fleets enema or Magnesium Citrate as directed.   ____ Use CHG Soap or sage wipes as directed on instruction sheet   ____ Use inhalers on the day of surgery and bring to hospital day of surgery  ____ Stop metformin 2 days prior to  surgery    ____ Take 1/2 of usual insulin dose the night before surgery and none on the morning of           surgery.   ____ Stop aspirin or coumadin, or plavix  x__ Stop Anti-inflammatories such as Advil, Aleve, Ibuprofen, Motrin, Naproxen,          Naprosyn, Goodies powders or aspirin products. Ok to take Tylenol.   ____ Stop supplements until after surgery.    ____ Bring C-Pap to the hospital.    

## 2016-01-29 ENCOUNTER — Encounter: Payer: Self-pay | Admitting: *Deleted

## 2016-01-30 ENCOUNTER — Ambulatory Visit: Payer: BLUE CROSS/BLUE SHIELD | Admitting: Anesthesiology

## 2016-01-30 ENCOUNTER — Ambulatory Visit
Admission: RE | Admit: 2016-01-30 | Discharge: 2016-01-30 | Disposition: A | Discharge status: Home / Self Care | Payer: BLUE CROSS/BLUE SHIELD | Source: Ambulatory Visit | Attending: Unknown Physician Specialty | Admitting: Unknown Physician Specialty

## 2016-01-30 ENCOUNTER — Encounter: Payer: Self-pay | Admitting: *Deleted

## 2016-01-30 ENCOUNTER — Encounter
Admission: RE | Disposition: A | Discharge status: Home / Self Care | Payer: Self-pay | Source: Ambulatory Visit | Attending: Unknown Physician Specialty

## 2016-01-30 DIAGNOSIS — Z885 Allergy status to narcotic agent status: Secondary | ICD-10-CM | POA: Diagnosis not present

## 2016-01-30 DIAGNOSIS — M898X6 Other specified disorders of bone, lower leg: Secondary | ICD-10-CM | POA: Insufficient documentation

## 2016-01-30 DIAGNOSIS — Z833 Family history of diabetes mellitus: Secondary | ICD-10-CM | POA: Diagnosis not present

## 2016-01-30 DIAGNOSIS — Z825 Family history of asthma and other chronic lower respiratory diseases: Secondary | ICD-10-CM | POA: Diagnosis not present

## 2016-01-30 DIAGNOSIS — G8929 Other chronic pain: Secondary | ICD-10-CM | POA: Insufficient documentation

## 2016-01-30 DIAGNOSIS — Z8249 Family history of ischemic heart disease and other diseases of the circulatory system: Secondary | ICD-10-CM | POA: Diagnosis not present

## 2016-01-30 DIAGNOSIS — M23232 Derangement of other medial meniscus due to old tear or injury, left knee: Secondary | ICD-10-CM | POA: Diagnosis present

## 2016-01-30 DIAGNOSIS — Z87891 Personal history of nicotine dependence: Secondary | ICD-10-CM | POA: Diagnosis not present

## 2016-01-30 DIAGNOSIS — Z6841 Body Mass Index (BMI) 40.0 and over, adult: Secondary | ICD-10-CM | POA: Diagnosis not present

## 2016-01-30 DIAGNOSIS — M25562 Pain in left knee: Secondary | ICD-10-CM | POA: Diagnosis not present

## 2016-01-30 DIAGNOSIS — M1712 Unilateral primary osteoarthritis, left knee: Secondary | ICD-10-CM | POA: Insufficient documentation

## 2016-01-30 DIAGNOSIS — Z880 Allergy status to penicillin: Secondary | ICD-10-CM | POA: Diagnosis not present

## 2016-01-30 HISTORY — PX: KNEE ARTHROSCOPY: SHX127

## 2016-01-30 LAB — URINE DRUG SCREEN, QUALITATIVE (ARMC ONLY)
Amphetamines, Ur Screen: NOT DETECTED
BARBITURATES, UR SCREEN: NOT DETECTED
BENZODIAZEPINE, UR SCRN: NOT DETECTED
Cannabinoid 50 Ng, Ur ~~LOC~~: NOT DETECTED
Cocaine Metabolite,Ur ~~LOC~~: NOT DETECTED
MDMA (Ecstasy)Ur Screen: NOT DETECTED
METHADONE SCREEN, URINE: NOT DETECTED
OPIATE, UR SCREEN: NOT DETECTED
Phencyclidine (PCP) Ur S: NOT DETECTED
TRICYCLIC, UR SCREEN: NOT DETECTED

## 2016-01-30 LAB — POCT PREGNANCY, URINE: Preg Test, Ur: NEGATIVE

## 2016-01-30 SURGERY — ARTHROSCOPY, KNEE
Anesthesia: General | Laterality: Left

## 2016-01-30 MED ORDER — FAMOTIDINE 20 MG PO TABS
ORAL_TABLET | ORAL | Status: AC
  Administered 2016-01-30: 20 mg via ORAL
  Filled 2016-01-30: qty 1

## 2016-01-30 MED ORDER — MIDAZOLAM HCL 2 MG/2ML IJ SOLN
INTRAMUSCULAR | Status: DC | PRN
  Administered 2016-01-30: 2 mg via INTRAVENOUS

## 2016-01-30 MED ORDER — DEXAMETHASONE SODIUM PHOSPHATE 10 MG/ML IJ SOLN
INTRAMUSCULAR | Status: DC | PRN
  Administered 2016-01-30: 10 mg via INTRAVENOUS

## 2016-01-30 MED ORDER — FAMOTIDINE 20 MG PO TABS
20.0000 mg | ORAL_TABLET | Freq: Once | ORAL | Status: AC
  Administered 2016-01-30: 20 mg via ORAL

## 2016-01-30 MED ORDER — PROPOFOL 10 MG/ML IV BOLUS
INTRAVENOUS | Status: DC | PRN
  Administered 2016-01-30: 30 mg via INTRAVENOUS
  Administered 2016-01-30: 170 mg via INTRAVENOUS

## 2016-01-30 MED ORDER — BUPIVACAINE HCL (PF) 0.5 % IJ SOLN
INTRAMUSCULAR | Status: AC
  Filled 2016-01-30: qty 30

## 2016-01-30 MED ORDER — BUPIVACAINE HCL (PF) 0.5 % IJ SOLN
INTRAMUSCULAR | Status: DC | PRN
  Administered 2016-01-30: 15 mL

## 2016-01-30 MED ORDER — GLYCOPYRROLATE 0.2 MG/ML IJ SOLN
INTRAMUSCULAR | Status: DC | PRN
Start: 2016-01-30 — End: 2016-01-30
  Administered 2016-01-30: 0.2 mg via INTRAVENOUS

## 2016-01-30 MED ORDER — ONDANSETRON HCL 4 MG/2ML IJ SOLN
INTRAMUSCULAR | Status: DC | PRN
  Administered 2016-01-30: 4 mg via INTRAVENOUS

## 2016-01-30 MED ORDER — LIDOCAINE HCL (CARDIAC) 20 MG/ML IV SOLN
INTRAVENOUS | Status: DC | PRN
  Administered 2016-01-30: 100 mg via INTRAVENOUS

## 2016-01-30 MED ORDER — LACTATED RINGERS IV SOLN
INTRAVENOUS | Status: DC
  Administered 2016-01-30 (×2): via INTRAVENOUS

## 2016-01-30 MED ORDER — FENTANYL CITRATE (PF) 100 MCG/2ML IJ SOLN: 25.0000 ug | INTRAMUSCULAR | Status: DC | PRN

## 2016-01-30 MED ORDER — FENTANYL CITRATE (PF) 100 MCG/2ML IJ SOLN
INTRAMUSCULAR | Status: DC | PRN
  Administered 2016-01-30 (×2): 50 ug via INTRAVENOUS
  Administered 2016-01-30 (×2): 25 ug via INTRAVENOUS
  Administered 2016-01-30: 50 ug via INTRAVENOUS

## 2016-01-30 MED ORDER — ONDANSETRON HCL 4 MG/2ML IJ SOLN: 4.0000 mg | Freq: Once | INTRAMUSCULAR | Status: DC | PRN

## 2016-01-30 SURGICAL SUPPLY — 40 items
ARTHROWAND PARAGON T2 (SURGICAL WAND)
BLADE ABRADER 4.5 (BLADE) ×3 IMPLANT
BLADE FULL RADIUS 3.5 (BLADE) ×3 IMPLANT
BLADE SHAVER 4.5X7 STR FR (MISCELLANEOUS) IMPLANT
BNDG ESMARK 6X12 TAN STRL LF (GAUZE/BANDAGES/DRESSINGS) ×3 IMPLANT
BUR ABRADER 4.0 W/FLUTE AQUA (MISCELLANEOUS) ×2 IMPLANT
BURR ABRADER 4.0 W/FLUTE AQUA (MISCELLANEOUS) ×6
CHLORAPREP W/TINT 26ML (MISCELLANEOUS) ×3 IMPLANT
CUFF TOURN 24 STER (MISCELLANEOUS) IMPLANT
CUFF TOURN 30 STER DUAL PORT (MISCELLANEOUS) ×3 IMPLANT
DRAPE LEGGINS SURG 28X43 STRL (DRAPES) ×3 IMPLANT
GAUZE SPONGE 4X4 12PLY STRL (GAUZE/BANDAGES/DRESSINGS) ×3 IMPLANT
GLOVE BIO SURGEON STRL SZ7.5 (GLOVE) ×3 IMPLANT
GLOVE BIO SURGEON STRL SZ8 (GLOVE) ×3 IMPLANT
GLOVE INDICATOR 8.0 STRL GRN (GLOVE) ×3 IMPLANT
GOWN STRL REUS W/ TWL LRG LVL3 (GOWN DISPOSABLE) ×1 IMPLANT
GOWN STRL REUS W/TWL LRG LVL3 (GOWN DISPOSABLE) ×2
GOWN STRL REUS W/TWL LRG LVL4 (GOWN DISPOSABLE) ×3 IMPLANT
IV LACTATED RINGER IRRG 3000ML (IV SOLUTION) ×4
IV LR IRRIG 3000ML ARTHROMATIC (IV SOLUTION) ×2 IMPLANT
KIT RM TURNOVER STRD PROC AR (KITS) ×3 IMPLANT
MANIFOLD 4PT FOR NEPTUNE1 (MISCELLANEOUS) ×3 IMPLANT
PACK ARTHROSCOPY KNEE (MISCELLANEOUS) ×3 IMPLANT
PADDING CAST 4IN STRL (MISCELLANEOUS)
PADDING CAST BLEND 4X4 STRL (MISCELLANEOUS) IMPLANT
SET TUBE SUCT SHAVER OUTFL 24K (TUBING) ×3 IMPLANT
SET TUBE TIP INTRA-ARTICULAR (MISCELLANEOUS) ×3 IMPLANT
SOL PREP PVP 2OZ (MISCELLANEOUS) ×3
SOLUTION PREP PVP 2OZ (MISCELLANEOUS) ×1 IMPLANT
SUT ETH BLK MONO 3 0 FS 1 12/B (SUTURE) ×3 IMPLANT
SUT ETHILON 3-0 FS-10 30 BLK (SUTURE) ×3
SUTURE EHLN 3-0 FS-10 30 BLK (SUTURE) ×1 IMPLANT
TAPE MICROFOAM 4IN (TAPE) ×3 IMPLANT
TUBING ARTHRO INFLOW-ONLY STRL (TUBING) ×3 IMPLANT
WAND 30 DEG SABER W/CORD (SURGICAL WAND) IMPLANT
WAND ARTHRO PARAGON T2 (SURGICAL WAND) IMPLANT
WAND COVAC 50 IFS (MISCELLANEOUS) IMPLANT
WAND HAND CNTRL MULTIVAC 50 (MISCELLANEOUS) IMPLANT
WAND HAND CNTRL MULTIVAC 90 (MISCELLANEOUS) IMPLANT
WRAP KNEE W/COLD PACKS 25.5X14 (SOFTGOODS) ×3 IMPLANT

## 2016-01-30 NOTE — Anesthesia Preprocedure Evaluation (Addendum)
Anesthesia Evaluation  Patient identified by MRN, date of birth, ID band Patient awake    Reviewed: Allergy & Precautions, NPO status , Patient's Chart, lab work & pertinent test results, reviewed documented beta blocker date and time   Airway Mallampati: III  TM Distance: >3 FB     Dental  (+) Chipped   Pulmonary former smoker,           Cardiovascular      Neuro/Psych    GI/Hepatic   Endo/Other  Morbid obesity  Renal/GU      Musculoskeletal   Abdominal   Peds  Hematology   Anesthesia Other Findings She needed O2 in pacu after umbilical hernia repair.  Reproductive/Obstetrics                            Anesthesia Physical Anesthesia Plan  ASA: III  Anesthesia Plan: General   Post-op Pain Management:    Induction: Intravenous  Airway Management Planned: Oral ETT and LMA  Additional Equipment:   Intra-op Plan:   Post-operative Plan:   Informed Consent: I have reviewed the patients History and Physical, chart, labs and discussed the procedure including the risks, benefits and alternatives for the proposed anesthesia with the patient or authorized representative who has indicated his/her understanding and acceptance.     Plan Discussed with: CRNA  Anesthesia Plan Comments:         Anesthesia Quick Evaluation

## 2016-01-30 NOTE — Op Note (Signed)
Patient: Jamie Potts, [name]  Preoperative diagnosis: Torn medial meniscus left knee with possible chondral lesion  Postop diagnosis: Torn medial meniscus left knee along with medial femoral chondral lesion  Operation: Arthroscopic partial medial meniscectomy plus microfracture of chondral lesion  Surgeon: Randon GoldsmithKERNODLE,Othmar Ringer B, MD  Anesthesia: Gen.   History: Patient's had a long history of left knee pain that was the result of a work injury.  The plain films revealed no bony pathology .  The patient had an MRI which revealed a torn medial meniscus plus possible medial femoral chondral pathology.The patient was scheduled for surgery due to persistent discomfort despite conservative treatment.  The patient was taken the operating room where satisfactory general anesthesia was achieved. A tourniquet and leg holder were was applied to the left thigh. A well leg support was applied to the nonoperative extremity. The left knee was prepped and draped in usual fashion for an arthroscopic procedure. An inflow cannula was introduced superomedially. The joint was distended with lactated Ringer's. Scope was introduced through an inferolateral puncture wound and a probe through an inferomedial puncture wound. Inspection of the medial compartment revealed  a significant radial tear at the junction of the posterior and middle thirds of the medial meniscus. The patient was also noted to have about a 1 cm in diameter almost full-thickness chondral lesion in the posterior aspect of the medial femoral condyle. I went ahead and resected the torn the medial meniscus with a combination of basket biters and a motorized resector. I then curetted the chondral lesion down to and through the calcified cartilage layer. The lesion was microfractured with a 45 awl. Inspection of the intercondylar notch revealed intact cruciates. Inspection of the the lateral compartment revealed no meniscal or chondral pathology.   Trochlear groove was  inspected and appeared to be fairly smooth.  Inspection of the retropatellar surface revealed some grade 2 chondral changes. I observed patella tracking from the inferolateral portal. The patella seemed to track fairly well.  The instruments were removed from the joint at this time. The puncture wounds were closed with 3-0 nylon in vertical mattress fashion. I injected each puncture wound with several cc of half percent Marcaine without epinephrine. Betadine was applied the wounds followed by sterile dressing. An ice pack was applied to the right knee. The patient was awakened and transferred to the stretcher bed. The patient was taken to the recovery room in satisfactory condition.  The tourniquet was not inflated during the course of the procedure. Blood loss was negligible.

## 2016-01-30 NOTE — Anesthesia Procedure Notes (Signed)
Procedure Name: LMA Insertion Date/Time: 01/30/2016 7:40 AM Performed by: Michaele OfferSAVAGE, Lilymae Swiech Pre-anesthesia Checklist: Patient identified, Emergency Drugs available, Suction available, Patient being monitored and Timeout performed Patient Re-evaluated:Patient Re-evaluated prior to inductionOxygen Delivery Method: Circle system utilized Preoxygenation: Pre-oxygenation with 100% oxygen Intubation Type: IV induction Ventilation: Mask ventilation without difficulty LMA: LMA inserted LMA Size: 4.0 Number of attempts: 1 Placement Confirmation: positive ETCO2 and breath sounds checked- equal and bilateral Tube secured with: Tape Dental Injury: Teeth and Oropharynx as per pre-operative assessment

## 2016-01-30 NOTE — Anesthesia Postprocedure Evaluation (Signed)
Anesthesia Post Note  Patient: Jamie Potts  Procedure(s) Performed: Procedure(s) (LRB): ARTHROSCOPY KNEE (Left)  Patient location during evaluation: PACU Anesthesia Type: General Level of consciousness: awake and alert Pain management: pain level controlled Vital Signs Assessment: post-procedure vital signs reviewed and stable Respiratory status: spontaneous breathing, nonlabored ventilation, respiratory function stable and patient connected to nasal cannula oxygen Cardiovascular status: blood pressure returned to baseline and stable Postop Assessment: no signs of nausea or vomiting Anesthetic complications: no    Last Vitals:  Vitals:   01/30/16 0957 01/30/16 1032  BP: (!) 138/96 130/85  Pulse: 83 72  Resp: 16 16  Temp: 36.4 C     Last Pain:  Vitals:   01/30/16 0957  TempSrc: Oral  PainSc:                  [name] S

## 2016-01-30 NOTE — H&P (Signed)
  H and P reviewed. No changes. Uploaded at later date. 

## 2016-01-30 NOTE — Progress Notes (Signed)
DBP  Ranging between  90 to 102   No new orders from dr Maisie Fusthomas at this time

## 2016-01-30 NOTE — Transfer of Care (Signed)
Immediate Anesthesia Transfer of Care Note  Patient: Jamie Potts  Procedure(s) Performed: Procedure(s): ARTHROSCOPY KNEE (Left)  Patient Location: PACU  Anesthesia Type:General  Level of Consciousness: awake, alert , oriented and patient cooperative  Airway & Oxygen Therapy: Patient Spontanous Breathing and Patient connected to face mask oxygen  Post-op Assessment: Report given to RN, Post -op Vital signs reviewed and stable and Patient moving all extremities X 4  Post vital signs: Reviewed and stable  Last Vitals:  Vitals:   01/30/16 0611  BP: (!) 157/90  Pulse: 87  Resp: 16  Temp: 36.7 C    Last Pain:  Vitals:   01/30/16 0611  TempSrc: Tympanic  PainSc: 2          Complications: No apparent anesthesia complications

## 2016-01-30 NOTE — Progress Notes (Signed)
Elevated leg on pillows and ice pack on

## 2016-01-30 NOTE — Discharge Instructions (Addendum)
General Anesthesia, Adult, Care After °Refer to this sheet in the next few weeks. These instructions provide you with information on caring for yourself after your procedure. Your health care provider may also give you more specific instructions. Your treatment has been planned according to current medical practices, but problems sometimes occur. Call your health care provider if you have any problems or questions after your procedure. °WHAT TO EXPECT AFTER THE PROCEDURE °After the procedure, it is typical to experience: °· Sleepiness. °· Nausea and vomiting. °HOME CARE INSTRUCTIONS °· For the first 24 hours after general anesthesia: °¨ Have a responsible person with you. °¨ Do not drive a car. If you are alone, do not take public transportation. °¨ Do not drink alcohol. °¨ Do not take medicine that has not been prescribed by your health care provider. °¨ Do not sign important papers or make important decisions. °¨ You may resume a normal diet and activities as directed by your health care provider. °· Change bandages (dressings) as directed. °· If you have questions or problems that seem related to general anesthesia, call the hospital and ask for the anesthetist or anesthesiologist on call. °SEEK MEDICAL CARE IF: °· You have nausea and vomiting that continue the day after anesthesia. °· You develop a rash. °SEEK IMMEDIATE MEDICAL CARE IF:  °· You have difficulty breathing. °· You have chest pain. °· You have any allergic problems. °  °This information is not intended to replace advice given to you by your health care provider. Make sure you discuss any questions you have with your health care provider. °  °Document Released: 06/16/2000 Document Revised: 03/31/2014 Document Reviewed: 07/09/2011 °Elsevier Interactive Patient Education ©2016 [employer] ° ° °[facility]  °Harold B. [facility], Jr., M.D. [phone]  ° °KNEE ARTHROSCOPY POST OPERATION INSTRUCTIONS: ° °PLEASE  READ THESE INSTRUCTIONS ABOUT POST OPERATION CARE. THEY WILL ANSWER MOST OF YOUR QUESTIONS.  °You have been given a prescription for pain. Please take as directed for pain.  °You can walk, keeping the knee slightly stiff-avoid doing too much bending the first day. (if ACL reconstruction is performed, keep brace locked in extension when walking.)  °You will use crutches or cane if needed. Can weight bear as tolerated  °Plan to take three to four days off from work. You can resume work when you are comfortable. (This can be a week or more, depending on the type of work you do.)  °To reduce pain and swelling, place one to two pillows under the knee the first two or three days when sitting or lying. An ice pack may be placed on top of the area over the dressing. Instructions for making homemade icepack are as follow:  °Flexible homemade alcohol water ice pack  °2 cups water  °1 cup rubbing alcohol  °food coloring for the blue tint (optional)  °2 zip-top bags - gallon-size  °Mix the water and alcohol together in one of your zip-top bags and add food coloring. Release as much air as possible and seal the bag. Place in freezer for at least 12 hours.  °The small incisions in your knee are closed with nylon stitches. They will be removed in the office.  °The bulky dressing may be removed in the third day after surgery. (If ACL surgery-DO NOT REMOVE BANDAGES). Put a waterproof band-aid over each stitch. Do not put any creams or ointments on wounds. You may shower at this time, but change waterproof band-aids after showering. KEEP INCISIONS CLEAN   AND DRY UNTIL YOU RETURN TO THE OFFICE.  °Sometimes the operative area remains somewhat painful and swollen for several weeks. This is usually nothing to worry about, but call if you have any excessive symptoms, especially fever. It is not unusual to have a low grade fever of 99 degrees for the first few days. If persist after 3-4 days call the office. It is not uncommon for the pain  to be a little worse on the third day after surgery.  °Begin doing gentle exercises right away. They will be limited by the amount of pain and swelling you have.  Exercising will reduce the swelling, increase motion, and prevent muscle weakness. Exercises: Straight leg raising and gentle knee bending.  °Take 81 milligram aspirin twice a day for 2 weeks after meals or milk. This along with elevation will help reduce the possibility of phlebitis in your operated leg.  °Avoid strenuous athletics for a minimum of 4 to 6 weeks after arthroscopic surgery (approximately five months if ACL surgery).  °If the surgery included ACL reconstruction the brace that is supplied to the extremity post surgery is to be locked in extension when you are asleep and is to be locked in extension when you are ambulating. It can be unlocked for exercises or sitting.  °Keep your post surgery appointment that has been made for you. If you do not remember the date call [phone]. Your follow up appointment should be between 7-10 days.  ° °

## 2018-02-05 ENCOUNTER — Other Ambulatory Visit: Payer: Self-pay | Admitting: Certified Nurse Midwife

## 2018-02-05 DIAGNOSIS — Z1231 Encounter for screening mammogram for malignant neoplasm of breast: Secondary | ICD-10-CM

## 2018-08-20 ENCOUNTER — Emergency Department: Payer: BLUE CROSS/BLUE SHIELD

## 2018-08-20 DIAGNOSIS — Z5321 Procedure and treatment not carried out due to patient leaving prior to being seen by health care provider: Secondary | ICD-10-CM | POA: Diagnosis not present

## 2018-08-20 DIAGNOSIS — M79605 Pain in left leg: Secondary | ICD-10-CM | POA: Insufficient documentation

## 2018-08-20 NOTE — ED Triage Notes (Signed)
Patient c/o left leg pain beginning Monday on upper/medial thigh. Patient reports pain moved to lower left leg on Tuesday and has not resolved. Patient reports hx of surgery to left knee 2 years ago.

## 2018-08-20 NOTE — ED Notes (Signed)
Patient transported to Ultrasound 

## 2018-08-21 ENCOUNTER — Emergency Department
Admission: EM | Admit: 2018-08-21 | Discharge: 2018-08-21 | Disposition: A | Discharge status: Home / Self Care | Payer: BLUE CROSS/BLUE SHIELD | Attending: Emergency Medicine | Admitting: Emergency Medicine

## 2018-08-21 NOTE — ED Notes (Signed)
Called no answer. Not seen in lobby 

## 2018-08-21 NOTE — ED Notes (Signed)
Patient seen walking outside.

## 2018-08-21 NOTE — ED Notes (Signed)
Called, no answer.

## 2019-05-13 ENCOUNTER — Other Ambulatory Visit: Payer: Self-pay | Admitting: Certified Nurse Midwife

## 2019-05-13 DIAGNOSIS — Z1231 Encounter for screening mammogram for malignant neoplasm of breast: Secondary | ICD-10-CM

## 2021-04-25 ENCOUNTER — Other Ambulatory Visit: Payer: Self-pay | Admitting: Obstetrics and Gynecology

## 2021-04-25 DIAGNOSIS — Z1231 Encounter for screening mammogram for malignant neoplasm of breast: Secondary | ICD-10-CM

## 2021-05-28 ENCOUNTER — Other Ambulatory Visit: Payer: Self-pay

## 2021-05-28 ENCOUNTER — Ambulatory Visit
Admission: RE | Admit: 2021-05-28 | Discharge: 2021-05-28 | Disposition: A | Discharge status: Home / Self Care | Payer: BC Managed Care – PPO | Source: Ambulatory Visit | Attending: Obstetrics and Gynecology | Admitting: Obstetrics and Gynecology

## 2021-05-28 DIAGNOSIS — Z1231 Encounter for screening mammogram for malignant neoplasm of breast: Secondary | ICD-10-CM | POA: Insufficient documentation

## 2021-10-14 ENCOUNTER — Ambulatory Visit: Payer: BC Managed Care – PPO | Admitting: Dermatology

## 2022-04-21 ENCOUNTER — Other Ambulatory Visit: Payer: Self-pay | Admitting: Obstetrics and Gynecology

## 2022-04-21 DIAGNOSIS — Z1231 Encounter for screening mammogram for malignant neoplasm of breast: Secondary | ICD-10-CM

## 2022-07-08 ENCOUNTER — Ambulatory Visit
Admission: RE | Admit: 2022-07-08 | Discharge: 2022-07-08 | Disposition: A | Discharge status: Home / Self Care | Payer: BC Managed Care – PPO | Source: Ambulatory Visit | Attending: Obstetrics and Gynecology | Admitting: Obstetrics and Gynecology

## 2022-07-08 DIAGNOSIS — Z1231 Encounter for screening mammogram for malignant neoplasm of breast: Secondary | ICD-10-CM | POA: Diagnosis present

## 2022-08-21 IMAGING — MG MM DIGITAL SCREENING BILAT W/ TOMO AND CAD
8 series · 8 of 24 positions shown · non-contrast
Comparison: Previous exam(s).

CLINICAL DATA: Screening.

EXAM:
DIGITAL SCREENING BILATERAL MAMMOGRAM WITH TOMOSYNTHESIS AND CAD
TECHNIQUE: Bilateral screening digital craniocaudal and mediolateral oblique
mammograms were obtained. Bilateral screening digital breast
tomosynthesis was performed. The images were evaluated with
computer-aided detection.

[L MLO synth-2D]
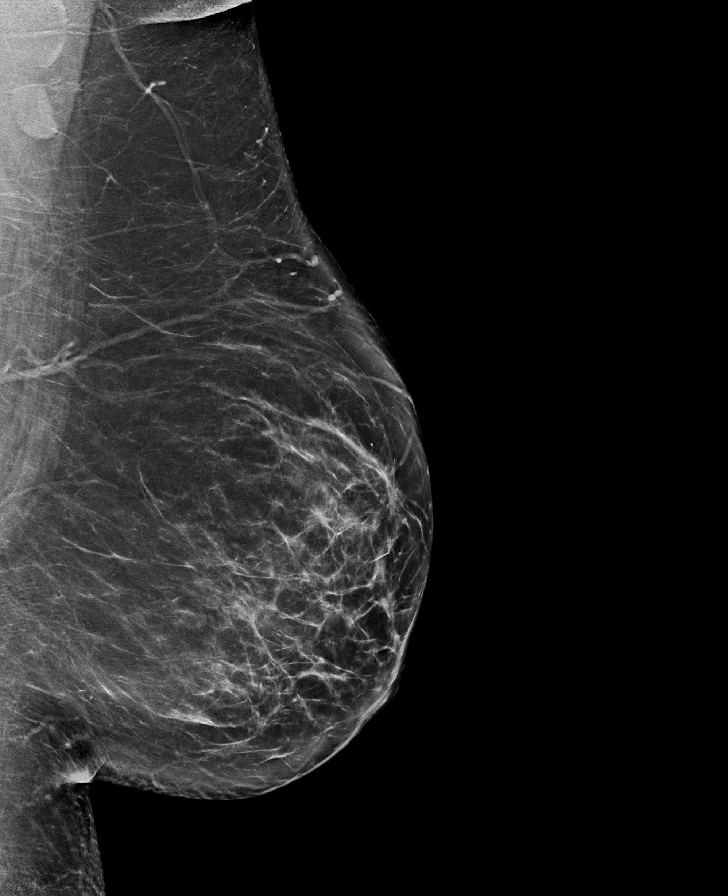

[L CC synth-2D]
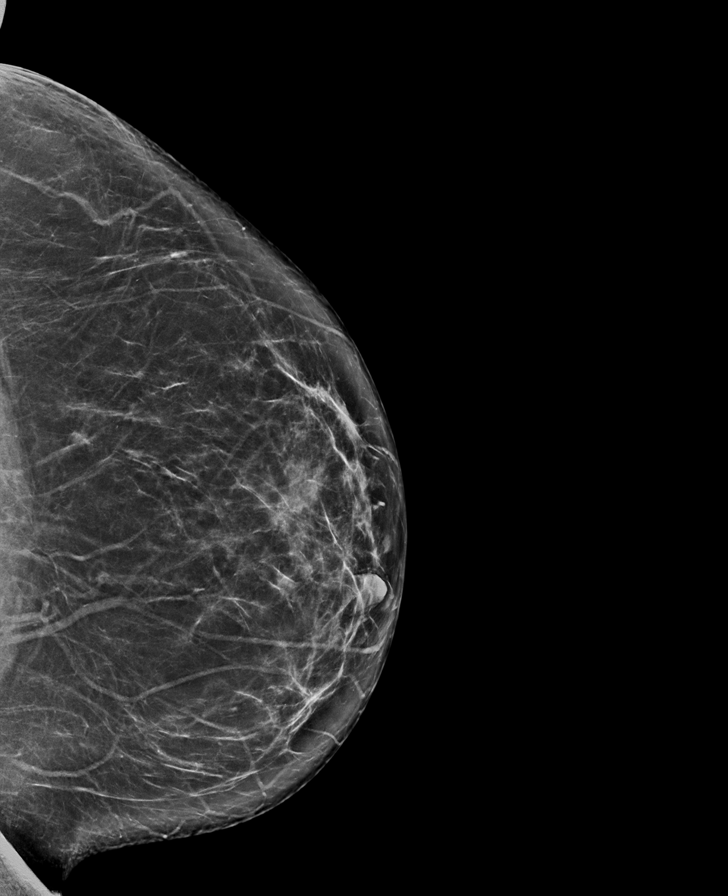

[R CC synth-2D]
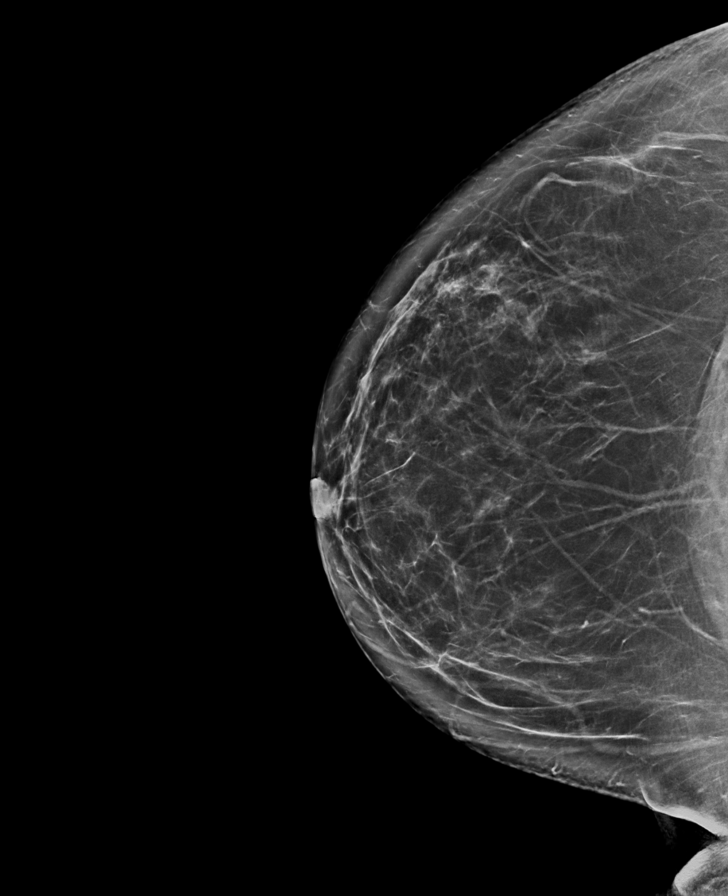

[R MLO synth-2D]
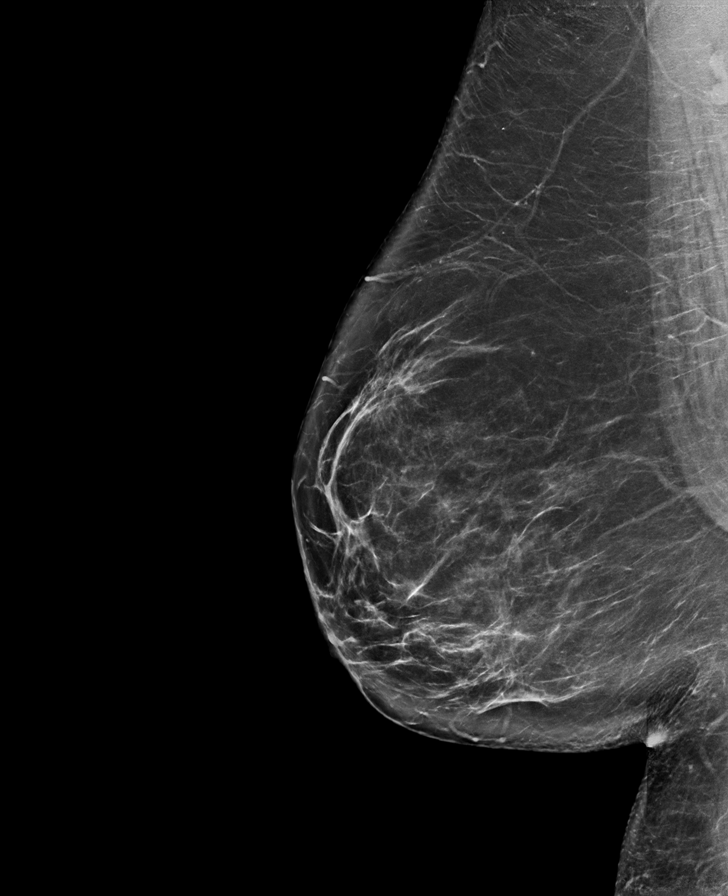

[R MLO tomo · tomo slice 46/91.0]
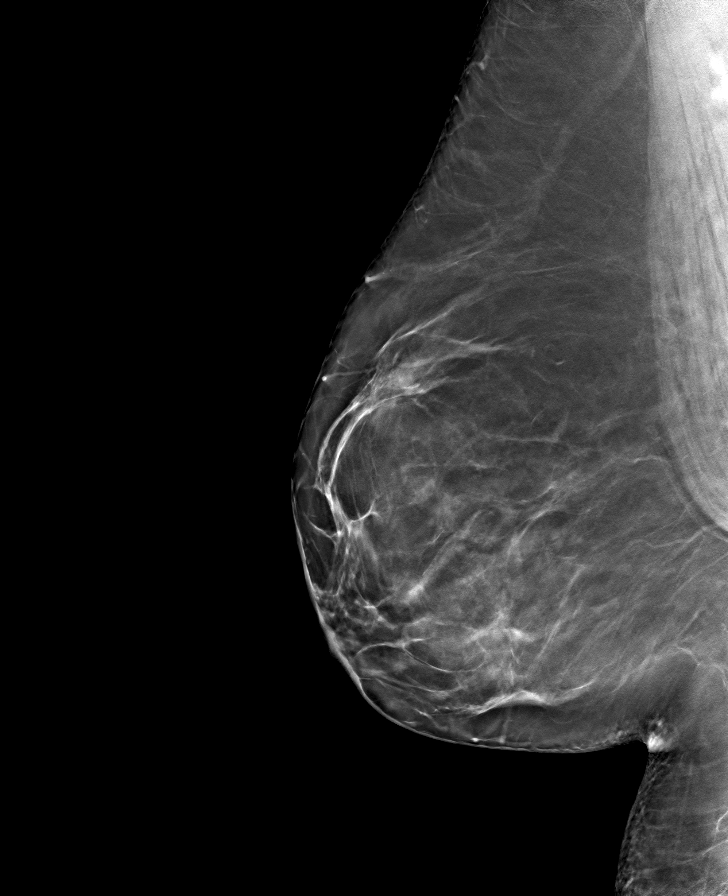

[L CC tomo · tomo slice 43/84.0]
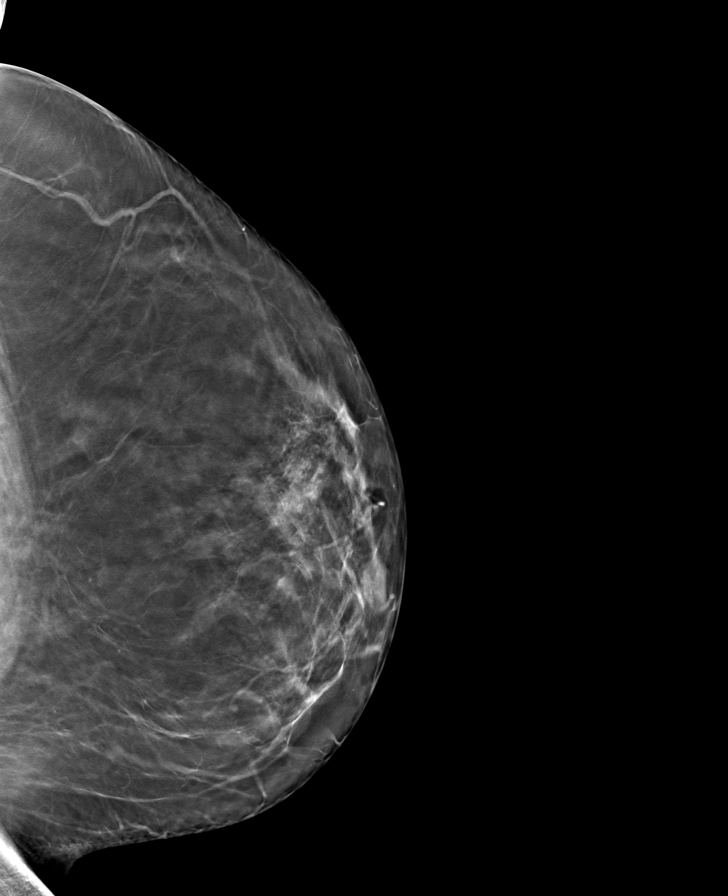

[R CC tomo · tomo slice 45/88.0]
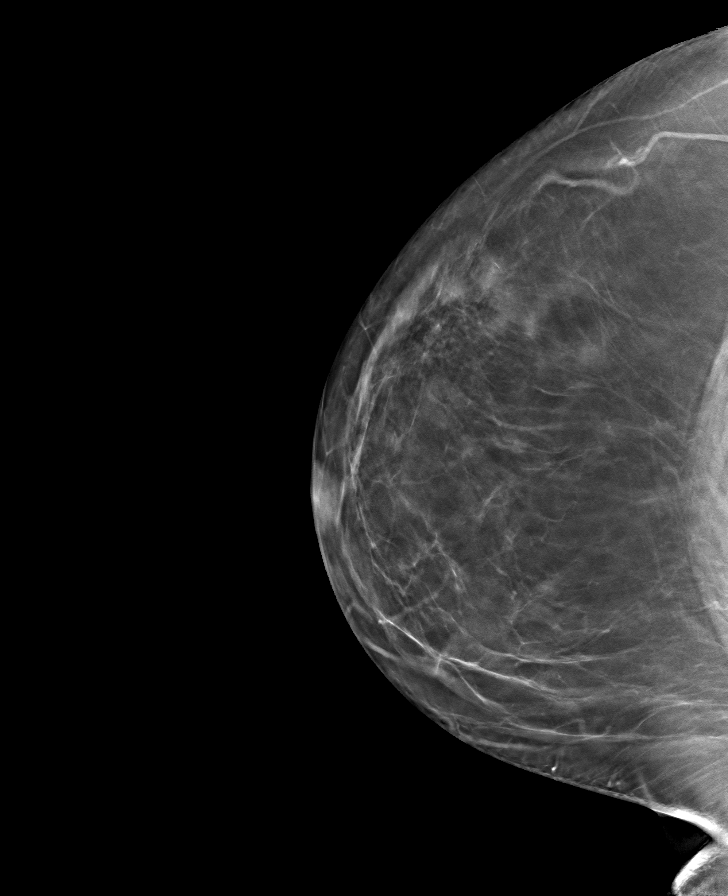

[L MLO tomo · tomo slice 46/91.0]
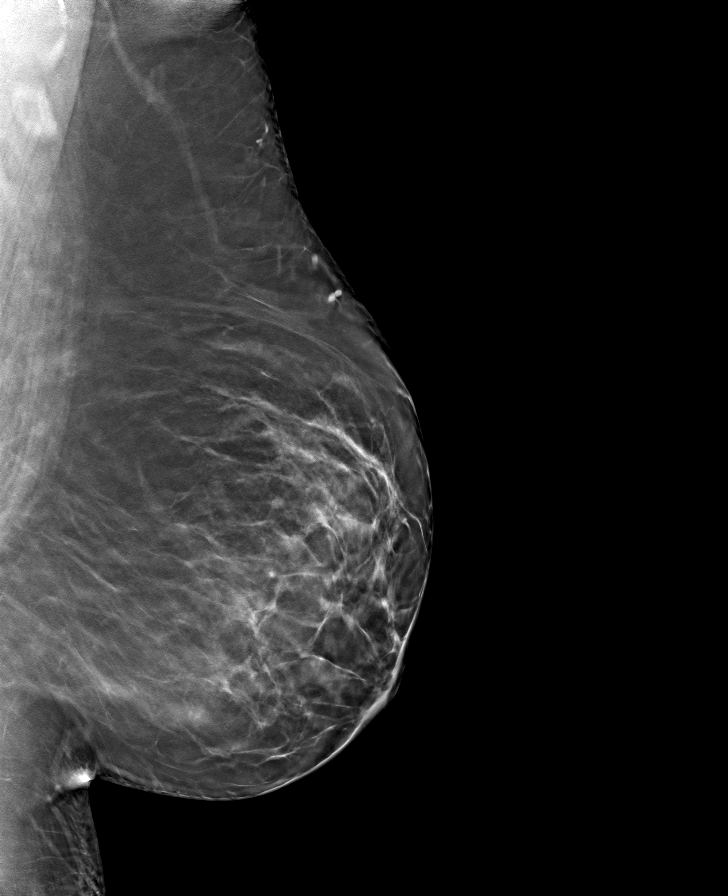

[8 of 24 positions shown; findings below may reference images not displayed]

ACR Breast Density Category b: There are scattered areas of
fibroglandular density.
FINDINGS: There are no findings suspicious for malignancy.
IMPRESSION: No mammographic evidence of malignancy. A result letter of this
screening mammogram will be mailed directly to the patient.

RECOMMENDATION:
Screening mammogram in one year. (Code:51-O-LD2)

BI-RADS CATEGORY  1: Negative.

## 2022-11-14 ENCOUNTER — Telehealth: Payer: Self-pay | Admitting: Family Medicine

## 2022-11-17 ENCOUNTER — Ambulatory Visit: Payer: Self-pay | Admitting: Family Medicine

## 2023-06-02 ENCOUNTER — Other Ambulatory Visit: Payer: Self-pay | Admitting: Obstetrics

## 2023-06-02 ENCOUNTER — Encounter: Payer: Self-pay | Admitting: Obstetrics and Gynecology

## 2023-06-02 DIAGNOSIS — Z1231 Encounter for screening mammogram for malignant neoplasm of breast: Secondary | ICD-10-CM

## 2023-07-14 ENCOUNTER — Encounter

## 2023-07-17 ENCOUNTER — Encounter

## 2023-09-03 ENCOUNTER — Encounter

## 2023-12-08 ENCOUNTER — Ambulatory Visit
Admission: RE | Admit: 2023-12-08 | Discharge: 2023-12-08 | Disposition: A | Discharge status: Home / Self Care | Source: Ambulatory Visit | Attending: Obstetrics | Admitting: Obstetrics

## 2023-12-08 DIAGNOSIS — Z1231 Encounter for screening mammogram for malignant neoplasm of breast: Secondary | ICD-10-CM | POA: Insufficient documentation

## 2023-12-10 ENCOUNTER — Encounter: Payer: Self-pay | Admitting: Obstetrics

## 2023-12-11 ENCOUNTER — Other Ambulatory Visit: Payer: Self-pay | Admitting: Certified Nurse Midwife

## 2023-12-11 DIAGNOSIS — R928 Other abnormal and inconclusive findings on diagnostic imaging of breast: Secondary | ICD-10-CM

## 2023-12-18 ENCOUNTER — Ambulatory Visit
Admission: RE | Admit: 2023-12-18 | Discharge: 2023-12-18 | Disposition: A | Discharge status: Home / Self Care | Source: Ambulatory Visit | Attending: Certified Nurse Midwife | Admitting: Certified Nurse Midwife

## 2023-12-18 DIAGNOSIS — R928 Other abnormal and inconclusive findings on diagnostic imaging of breast: Secondary | ICD-10-CM
